# Patient Record
Sex: Female | Born: 1960
Health system: Southern US, Community
[De-identification: ages and names within clinical notes are randomized; demographics above are authoritative.]

## PROBLEM LIST (undated history)

## (undated) DIAGNOSIS — I10 Essential (primary) hypertension: Secondary | ICD-10-CM

## (undated) DIAGNOSIS — I499 Cardiac arrhythmia, unspecified: Secondary | ICD-10-CM

## (undated) DIAGNOSIS — F4322 Adjustment disorder with anxiety: Secondary | ICD-10-CM

## (undated) HISTORY — PX: ABDOMINAL HYSTERECTOMY: SHX81

---

## 2005-07-31 ENCOUNTER — Ambulatory Visit: Payer: Self-pay | Admitting: Family Medicine

## 2013-03-26 ENCOUNTER — Emergency Department: Payer: Self-pay | Admitting: Emergency Medicine

## 2013-03-26 LAB — BASIC METABOLIC PANEL
Anion Gap: 9 (ref 7–16)
Calcium, Total: 9.3 mg/dL (ref 8.5–10.1)
Chloride: 104 mmol/L (ref 98–107)
Co2: 27 mmol/L (ref 21–32)
Creatinine: 0.87 mg/dL (ref 0.60–1.30)
EGFR (African American): >60
Osmolality: 276 (ref 275–301)
Potassium: 3.9 mmol/L (ref 3.5–5.1)

## 2013-03-26 LAB — URINALYSIS, COMPLETE
Glucose,UR: NEGATIVE mg/dL (ref 0–75)
Ketone: NEGATIVE
Leukocyte Esterase: NEGATIVE
Nitrite: NEGATIVE
Ph: 6 (ref 4.5–8.0)
RBC,UR: NONE SEEN /HPF (ref 0–5)
Specific Gravity: 1.002 (ref 1.003–1.030)
Squamous Epithelial: NONE SEEN
WBC UR: NONE SEEN /HPF (ref 0–5)

## 2013-03-26 LAB — CK TOTAL AND CKMB (NOT AT ARMC)
CK, Total: 180 U/L (ref 21–215)
CK, Total: 144 U/L (ref 21–215)
CK-MB: 2.2 ng/mL (ref 0.5–3.6)
CK-MB: 1.9 ng/mL (ref 0.5–3.6)

## 2013-03-26 LAB — TROPONIN I
Troponin-I: <0.02 ng/mL
Troponin-I: <0.02 ng/mL

## 2013-03-26 LAB — HEPATIC FUNCTION PANEL A (ARMC)
Albumin: 4.5 g/dL (ref 3.4–5.0)
Bilirubin,Total: 0.4 mg/dL (ref 0.2–1.0)
SGOT(AST): 24 U/L (ref 15–37)
SGPT (ALT): 17 U/L (ref 12–78)

## 2013-03-26 LAB — CBC: Platelet: 194 10*3/uL (ref 150–440)

## 2013-03-26 LAB — MAGNESIUM: Magnesium: 1.6 mg/dL — ABNORMAL LOW

## 2013-07-18 ENCOUNTER — Ambulatory Visit: Payer: Self-pay | Admitting: Gastroenterology

## 2013-07-22 LAB — PATHOLOGY REPORT

## 2013-07-24 ENCOUNTER — Ambulatory Visit: Payer: Self-pay | Admitting: Internal Medicine

## 2013-08-01 ENCOUNTER — Ambulatory Visit: Payer: Self-pay | Admitting: Rheumatology

## 2013-09-23 ENCOUNTER — Emergency Department: Payer: Self-pay | Admitting: Emergency Medicine

## 2013-09-23 LAB — BASIC METABOLIC PANEL
Anion Gap: 6 — ABNORMAL LOW (ref 7–16)
BUN: 13 mg/dL (ref 7–18)
CHLORIDE: 108 mmol/L — AB (ref 98–107)
CO2: 28 mmol/L (ref 21–32)
Calcium, Total: 8.9 mg/dL (ref 8.5–10.1)
Creatinine: 0.84 mg/dL (ref 0.60–1.30)
EGFR (African American): >60
EGFR (Non-African Amer.): >60
Glucose: 107 mg/dL — ABNORMAL HIGH (ref 65–99)
OSMOLALITY: 284 (ref 275–301)
Potassium: 3.6 mmol/L (ref 3.5–5.1)
SODIUM: 142 mmol/L (ref 136–145)

## 2013-09-23 LAB — TROPONIN I: Troponin-I: <0.02 ng/mL

## 2013-09-23 LAB — CBC
HCT: 40.8 % (ref 35.0–47.0)
HGB: 14.1 g/dL (ref 12.0–16.0)
MCH: 29.3 pg (ref 26.0–34.0)
MCHC: 34.5 g/dL (ref 32.0–36.0)
MCV: 85 fL (ref 80–100)
Platelet: 200 10*3/uL (ref 150–440)
RBC: 4.81 10*6/uL (ref 3.80–5.20)
RDW: 13.7 % (ref 11.5–14.5)
WBC: 9.3 10*3/uL (ref 3.6–11.0)

## 2013-10-24 ENCOUNTER — Ambulatory Visit: Payer: Self-pay | Admitting: Rheumatology

## 2013-12-02 DIAGNOSIS — M7918 Myalgia, other site: Secondary | ICD-10-CM | POA: Insufficient documentation

## 2014-02-12 DIAGNOSIS — M62838 Other muscle spasm: Secondary | ICD-10-CM | POA: Insufficient documentation

## 2014-05-18 DIAGNOSIS — M542 Cervicalgia: Secondary | ICD-10-CM | POA: Insufficient documentation

## 2014-09-05 DIAGNOSIS — L409 Psoriasis, unspecified: Secondary | ICD-10-CM | POA: Insufficient documentation

## 2014-09-05 DIAGNOSIS — E7800 Pure hypercholesterolemia, unspecified: Secondary | ICD-10-CM | POA: Insufficient documentation

## 2014-09-17 DIAGNOSIS — M5416 Radiculopathy, lumbar region: Secondary | ICD-10-CM | POA: Insufficient documentation

## 2014-09-24 DIAGNOSIS — Z720 Tobacco use: Secondary | ICD-10-CM | POA: Insufficient documentation

## 2014-09-24 DIAGNOSIS — G479 Sleep disorder, unspecified: Secondary | ICD-10-CM | POA: Insufficient documentation

## 2014-09-29 ENCOUNTER — Ambulatory Visit: Payer: Self-pay | Admitting: Physical Medicine and Rehabilitation

## 2014-09-30 ENCOUNTER — Ambulatory Visit: Payer: Self-pay | Admitting: Family Medicine

## 2014-11-12 ENCOUNTER — Ambulatory Visit: Payer: Self-pay | Admitting: Family Medicine

## 2015-07-16 ENCOUNTER — Other Ambulatory Visit: Payer: Self-pay | Admitting: Neurology

## 2015-07-16 ENCOUNTER — Telehealth (HOSPITAL_COMMUNITY): Payer: Self-pay | Admitting: Neurology

## 2015-07-16 DIAGNOSIS — H532 Diplopia: Secondary | ICD-10-CM

## 2015-07-30 ENCOUNTER — Ambulatory Visit
Admission: RE | Admit: 2015-07-30 | Discharge: 2015-07-30 | Disposition: A | Discharge status: Home / Self Care | Payer: BLUE CROSS/BLUE SHIELD | Source: Ambulatory Visit | Attending: Neurology | Admitting: Neurology

## 2015-07-30 DIAGNOSIS — H532 Diplopia: Secondary | ICD-10-CM | POA: Diagnosis present

## 2015-11-08 DIAGNOSIS — M5136 Other intervertebral disc degeneration, lumbar region: Secondary | ICD-10-CM | POA: Insufficient documentation

## 2016-08-14 DIAGNOSIS — E538 Deficiency of other specified B group vitamins: Secondary | ICD-10-CM | POA: Insufficient documentation

## 2016-08-14 DIAGNOSIS — R202 Paresthesia of skin: Secondary | ICD-10-CM | POA: Insufficient documentation

## 2016-08-14 DIAGNOSIS — G609 Hereditary and idiopathic neuropathy, unspecified: Secondary | ICD-10-CM | POA: Insufficient documentation

## 2016-10-17 DIAGNOSIS — G5603 Carpal tunnel syndrome, bilateral upper limbs: Secondary | ICD-10-CM | POA: Insufficient documentation

## 2016-10-20 IMAGING — MR MRI LUMBAR SPINE WITHOUT CONTRAST
4 of 5 series · 24 of 48 positions shown · non-contrast
Comparison: Thoracic MRI 08/01/2013.

CLINICAL DATA: Low back pain with bilateral lower extremity
tingling and burning for 1 year. No acute injury or prior relevant
surgery. Initial encounter.

EXAM:
MRI LUMBAR SPINE WITHOUT CONTRAST
TECHNIQUE: Multiplanar, multisequence MR imaging of the lumbar spine was
performed. No intravenous contrast was administered.

[Series 2: T2 · sagittal · 4.0mm · 0.81mm/px · 5 of 13 slices shown (1 of 2)]
[im 1/13]
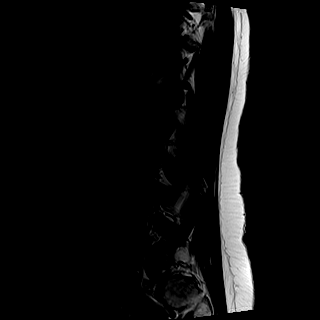
[im 4/13]
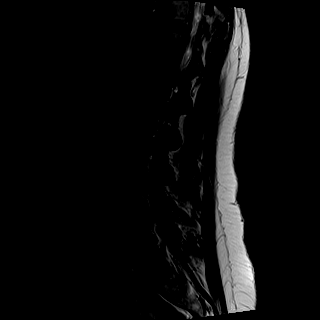
[im 7/13]
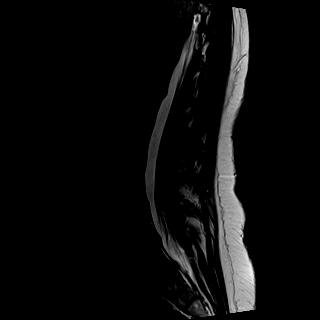
[im 10/13]
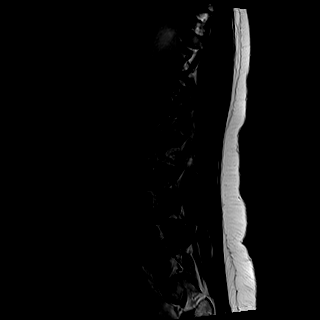
[im 13/13]
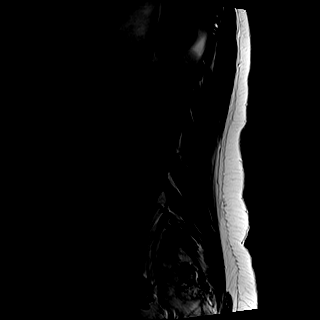

[Series 3: T1 · sagittal · 4.0mm · 0.41mm/px · 5 of 13 slices shown (1 of 2)]
[im 1/13]
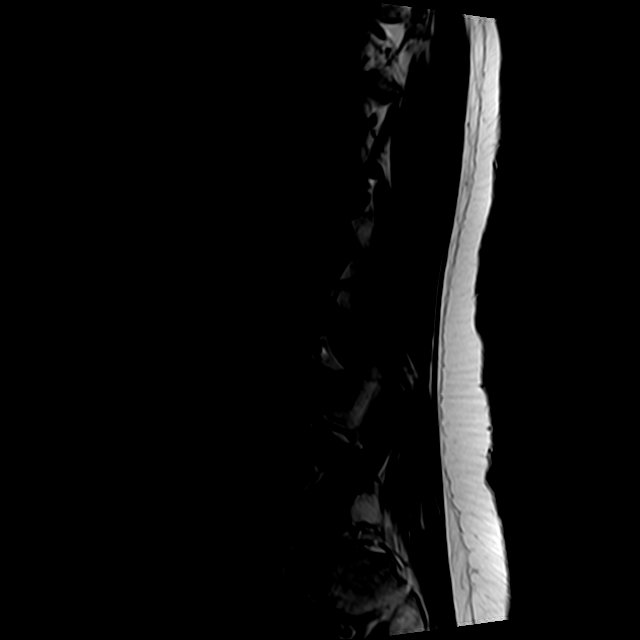
[im 4/13]
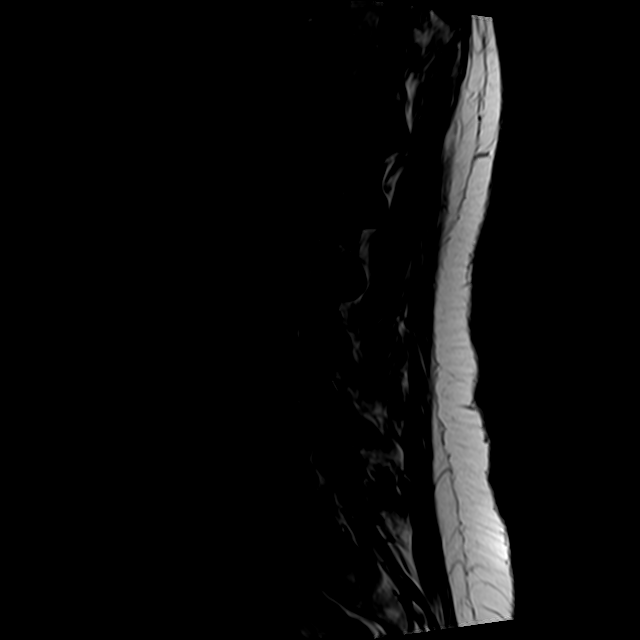
[im 7/13]
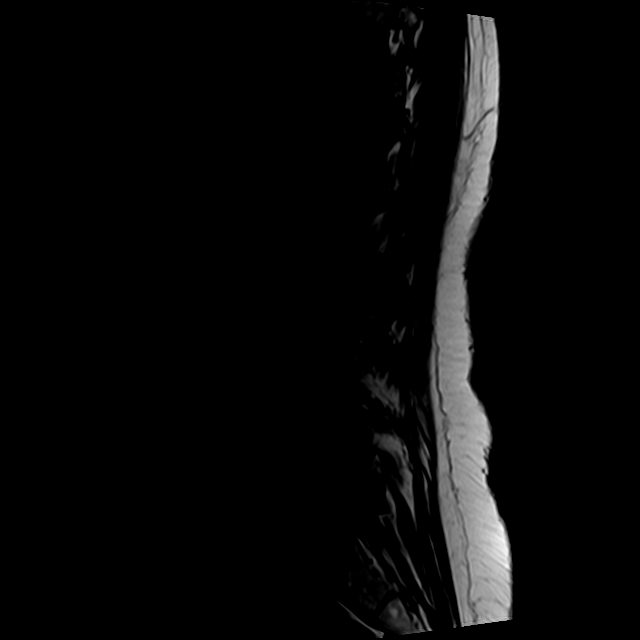
[im 10/13]
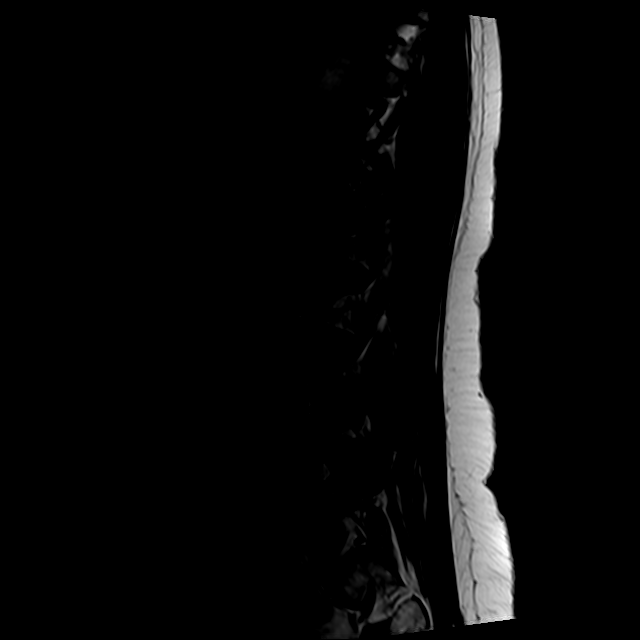
[im 13/13]
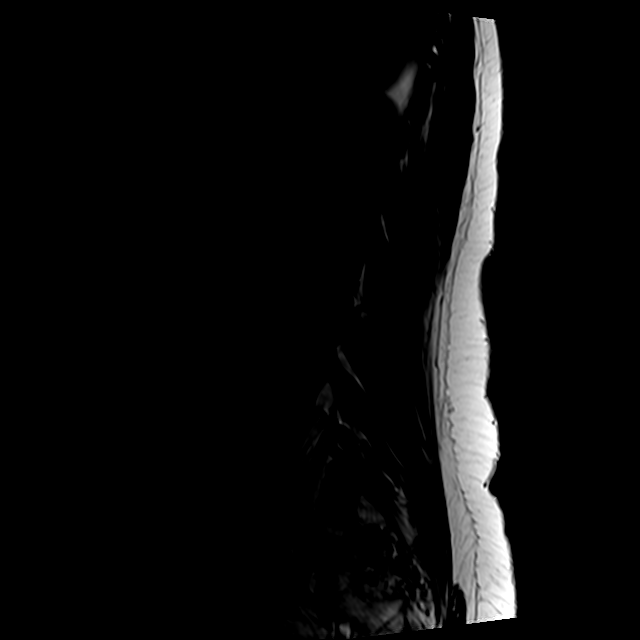

[Series 5: T2 · axial · 4.0mm · 0.78mm/px · z∈[-137,+73]mm · 10 of 36 slices shown (2 of 2)]
[im 3/36]
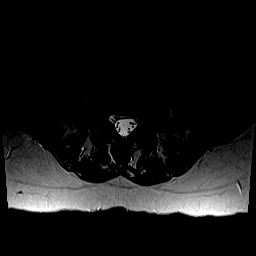
[im 5/36]
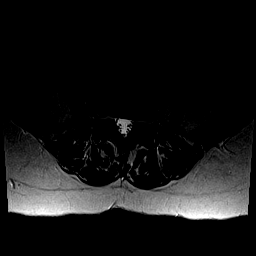
[im 8/36]
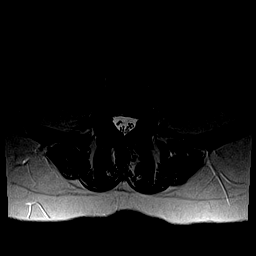
[im 12/36]
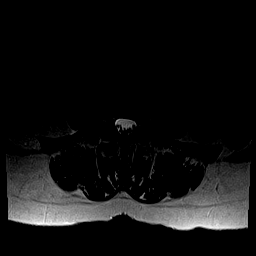
[im 17/36]
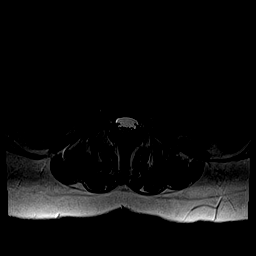
[im 19/36]
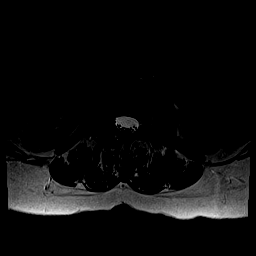
[im 22/36]
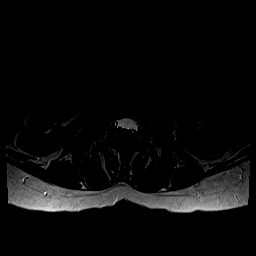
[im 26/36]
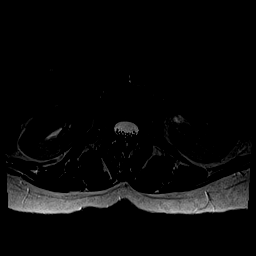
[im 31/36]
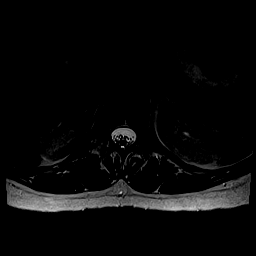
[im 36/36]
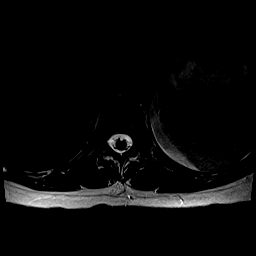

[Series 6: T1 · axial · 4.0mm · 0.31mm/px · z∈[-137,+48]mm · 4 of 36 slices shown (2 of 2)]
[im 3/36]
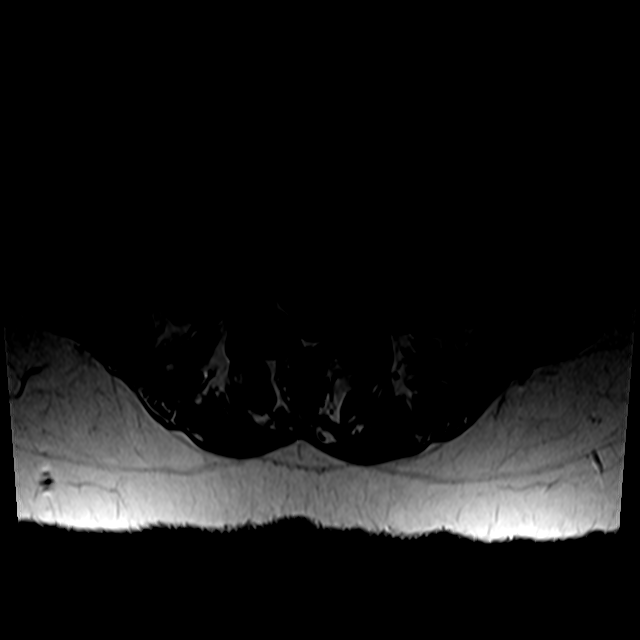
[im 5/36]
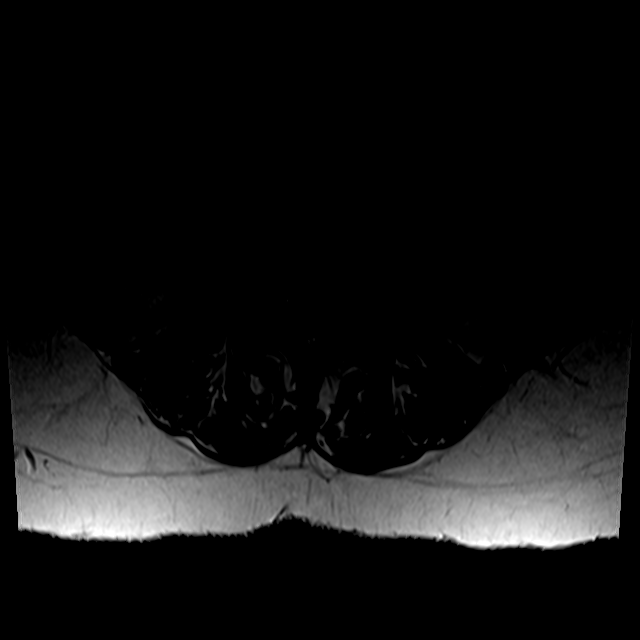
[im 19/36]
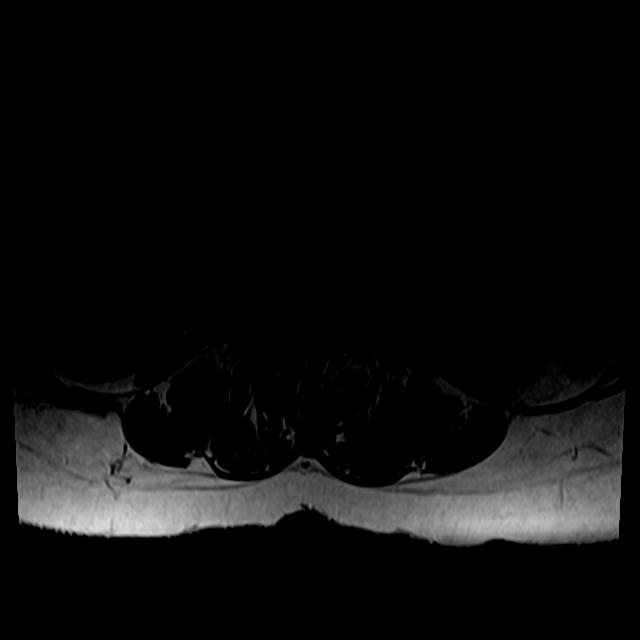
[im 31/36]
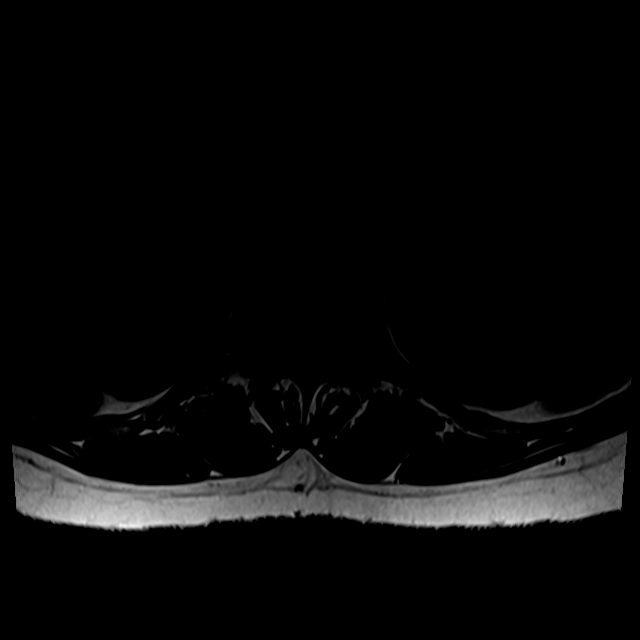

[24 of 48 positions shown; findings below may reference images not displayed]

FINDINGS: There is transitional lumbosacral anatomy. Based on the numbering
applied to the prior thoracic examination, there is a transitional
partially sacralized L5 segment. There is a mild convex left
scoliosis. There is no evidence of fracture or pars defect.

The conus medullaris extends to the T12-L1 level and appears normal.
No paraspinal abnormalities are identified.

There is stable disc and endplate degeneration at T10-11 and T12-L1,
but no resulting spinal stenosis or nerve root encroachment.

There are no significant disc space findings at L1-2 or L2-3.

L3-4: Mild disc bulging, facet and ligamentous hypertrophy. There is
a slight anterolisthesis, but no resulting spinal stenosis or nerve
root encroachment.

L4-5: The disc is degenerated with mild loss of height and annular
bulging. There is mild facet and ligamentous hypertrophy. No
significant spinal stenosis or nerve root encroachment.

L5-S1: As numbered, this is a transitional disc space level without
acquired abnormality.
IMPRESSION: 1. Transitional lumbosacral anatomy. As correlated with prior
examination of the thoracic spine, there is a transitional partially
sacralized L5 segment.
2. Disc bulging, facet and ligamentous hypertrophy at L3-4 and L4-5
without resulting spinal stenosis or nerve root encroachment.
3. No acute findings.

## 2016-11-30 ENCOUNTER — Other Ambulatory Visit: Payer: Self-pay | Admitting: Family Medicine

## 2016-11-30 DIAGNOSIS — Z1231 Encounter for screening mammogram for malignant neoplasm of breast: Secondary | ICD-10-CM

## 2016-12-12 ENCOUNTER — Ambulatory Visit
Admission: RE | Admit: 2016-12-12 | Discharge: 2016-12-12 | Disposition: A | Discharge status: Home / Self Care | Payer: BLUE CROSS/BLUE SHIELD | Source: Ambulatory Visit | Attending: Family Medicine | Admitting: Family Medicine

## 2016-12-12 DIAGNOSIS — Z1231 Encounter for screening mammogram for malignant neoplasm of breast: Secondary | ICD-10-CM | POA: Diagnosis not present

## 2017-10-17 ENCOUNTER — Other Ambulatory Visit: Payer: Self-pay | Admitting: Family Medicine

## 2017-10-17 DIAGNOSIS — R1011 Right upper quadrant pain: Secondary | ICD-10-CM

## 2018-09-19 DIAGNOSIS — I10 Essential (primary) hypertension: Secondary | ICD-10-CM | POA: Insufficient documentation

## 2018-09-19 DIAGNOSIS — F4322 Adjustment disorder with anxiety: Secondary | ICD-10-CM | POA: Insufficient documentation

## 2021-11-29 DIAGNOSIS — Z0000 Encounter for general adult medical examination without abnormal findings: Secondary | ICD-10-CM | POA: Diagnosis not present

## 2021-11-29 DIAGNOSIS — Z23 Encounter for immunization: Secondary | ICD-10-CM | POA: Diagnosis not present

## 2021-11-29 DIAGNOSIS — I10 Essential (primary) hypertension: Secondary | ICD-10-CM | POA: Diagnosis not present

## 2021-11-29 DIAGNOSIS — E7800 Pure hypercholesterolemia, unspecified: Secondary | ICD-10-CM | POA: Diagnosis not present

## 2021-11-29 DIAGNOSIS — E559 Vitamin D deficiency, unspecified: Secondary | ICD-10-CM | POA: Diagnosis not present

## 2021-11-29 DIAGNOSIS — Z1331 Encounter for screening for depression: Secondary | ICD-10-CM | POA: Diagnosis not present

## 2021-11-29 DIAGNOSIS — R7303 Prediabetes: Secondary | ICD-10-CM | POA: Diagnosis not present

## 2022-09-12 DIAGNOSIS — R7303 Prediabetes: Secondary | ICD-10-CM | POA: Insufficient documentation

## 2022-09-12 DIAGNOSIS — M5135 Other intervertebral disc degeneration, thoracolumbar region: Secondary | ICD-10-CM | POA: Insufficient documentation

## 2023-03-27 ENCOUNTER — Other Ambulatory Visit: Payer: Self-pay

## 2023-03-27 ENCOUNTER — Other Ambulatory Visit: Payer: Self-pay | Admitting: Family Medicine

## 2023-03-27 ENCOUNTER — Other Ambulatory Visit: Payer: Self-pay | Admitting: *Deleted

## 2023-03-27 ENCOUNTER — Telehealth: Payer: Self-pay | Admitting: Gastroenterology

## 2023-03-27 DIAGNOSIS — F17210 Nicotine dependence, cigarettes, uncomplicated: Secondary | ICD-10-CM

## 2023-03-27 DIAGNOSIS — Z1211 Encounter for screening for malignant neoplasm of colon: Secondary | ICD-10-CM

## 2023-03-27 DIAGNOSIS — Z1231 Encounter for screening mammogram for malignant neoplasm of breast: Secondary | ICD-10-CM

## 2023-03-27 NOTE — Telephone Encounter (Signed)
Message left for patient to return my call.  

## 2023-03-27 NOTE — Telephone Encounter (Signed)
Pt left message to schedule colonoscopy please return call  

## 2023-03-28 NOTE — Telephone Encounter (Signed)
Patient returned the call to schedule her colonoscopy. The best time to call is early mornings.

## 2023-03-29 ENCOUNTER — Other Ambulatory Visit: Payer: Self-pay

## 2023-03-29 DIAGNOSIS — Z1211 Encounter for screening for malignant neoplasm of colon: Secondary | ICD-10-CM

## 2023-03-29 MED ORDER — NA SULFATE-K SULFATE-MG SULF 17.5-3.13-1.6 GM/177ML PO SOLN: 1.0000 | Freq: Once | ORAL | 0 refills | Status: AC

## 2023-03-29 NOTE — Addendum Note (Signed)
Addended by: Avie Arenas on: 03/29/2023 09:59 AM   Modules accepted: Orders

## 2023-03-29 NOTE — Telephone Encounter (Signed)
Gastroenterology Pre-Procedure Review  Request Date: 05/03/23 Requesting Physician: Dr. Allegra Lai  PATIENT REVIEW QUESTIONS: The patient responded to the following health history questions as indicated:    1. Are you having any GI issues? no 2. Do you have a personal history of Polyps? no 3. Do you have a family history of Colon Cancer or Polyps? no 4. Diabetes Mellitus? no 5. Joint replacements in the past 12 months?no 6. Major health problems in the past 3 months?no 7. Any artificial heart valves, MVP, or defibrillator?no    MEDICATIONS & ALLERGIES:    Patient reports the following regarding taking any anticoagulation/antiplatelet therapy:   Plavix, Coumadin, Eliquis, Xarelto, Lovenox, Pradaxa, Brilinta, or Effient? no Aspirin? no  Patient confirms/reports the following medications:  Current Outpatient Medications  Medication Sig Dispense Refill   acetaminophen (TYLENOL) 325 MG tablet Take by mouth.     amLODipine (NORVASC) 10 MG tablet Take 1 tablet by mouth daily.     aspirin EC 81 MG tablet Take by mouth.     atorvastatin (LIPITOR) 10 MG tablet Take 1 tablet by mouth daily.     cholecalciferol (VITAMIN D3) 25 MCG (1000 UNIT) tablet Take 1,000 Units by mouth daily.     ibuprofen (ADVIL) 200 MG tablet Take by mouth.     metFORMIN (GLUCOPHAGE) 500 MG tablet Take 1 tablet by mouth daily with breakfast.     metoprolol succinate (TOPROL-XL) 25 MG 24 hr tablet Take 1 tablet by mouth daily.     Na Sulfate-K Sulfate-Mg Sulf 17.5-3.13-1.6 GM/177ML SOLN Take 1 kit by mouth once for 1 dose. 354 mL 0   No current facility-administered medications for this visit.    Patient confirms/reports the following allergies:  Allergies  Allergen Reactions   Bupropion Other (See Comments)    Trouble focusing    No orders of the defined types were placed in this encounter.   AUTHORIZATION INFORMATION Primary Insurance: 1D#: Group #:  Secondary Insurance: 1D#: Group #:  SCHEDULE  INFORMATION: Date: 05/03/23 Time: Location: [facility]

## 2023-04-03 ENCOUNTER — Ambulatory Visit
Admission: RE | Admit: 2023-04-03 | Discharge: 2023-04-03 | Disposition: A | Discharge status: Home / Self Care | Payer: 59 | Source: Ambulatory Visit | Attending: Family Medicine | Admitting: Family Medicine

## 2023-04-03 DIAGNOSIS — F17210 Nicotine dependence, cigarettes, uncomplicated: Secondary | ICD-10-CM | POA: Diagnosis present

## 2023-04-09 ENCOUNTER — Encounter: Payer: Self-pay | Admitting: Gastroenterology

## 2023-04-24 ENCOUNTER — Ambulatory Visit
Admission: RE | Admit: 2023-04-24 | Discharge: 2023-04-24 | Disposition: A | Discharge status: Home / Self Care | Payer: 59 | Source: Ambulatory Visit | Attending: Family Medicine | Admitting: Family Medicine

## 2023-04-24 DIAGNOSIS — Z1231 Encounter for screening mammogram for malignant neoplasm of breast: Secondary | ICD-10-CM | POA: Insufficient documentation

## 2023-04-26 ENCOUNTER — Telehealth: Payer: Self-pay

## 2023-04-26 ENCOUNTER — Telehealth: Payer: Self-pay | Admitting: Gastroenterology

## 2023-04-26 NOTE — Telephone Encounter (Signed)
Patient call returned.  Left voice message to let her know that she is currently scheduled at [facility] with Dr. Allegra Lai for 05/10/23.  Requested a call back to discuss her location change request.  Thanks,  Marcelino Duster, [place]

## 2023-04-26 NOTE — Telephone Encounter (Signed)
Pt left message to change location for up coming procedure  on 05/10/2023 requesting a call back

## 2023-05-03 ENCOUNTER — Encounter: Payer: Self-pay | Admitting: Gastroenterology

## 2023-05-03 NOTE — Anesthesia Preprocedure Evaluation (Addendum)
Anesthesia Evaluation  Patient identified by MRN, date of birth, ID band Patient awake    Reviewed: Allergy & Precautions, H&P , NPO status , Patient's Chart, lab work & pertinent test results  Airway Mallampati: I  TM Distance: >3 FB Neck ROM: Full    Dental no notable dental hx. (+) Upper Dentures, Lower Dentures Dentures have dental adhesive and stay in well:   Pulmonary neg pulmonary ROS   Pulmonary exam normal breath sounds clear to auscultation       Cardiovascular hypertension, Normal cardiovascular exam+ dysrhythmias  Rhythm:Regular Rate:Normal     Neuro/Psych  PSYCHIATRIC DISORDERS       Neuromuscular disease negative neurological ROS  negative psych ROS   GI/Hepatic negative GI ROS, Neg liver ROS,,,  Endo/Other  negative endocrine ROS    Renal/GU negative Renal ROS  negative genitourinary   Musculoskeletal negative musculoskeletal ROS (+) Arthritis ,    Abdominal   Peds negative pediatric ROS (+)  Hematology negative hematology ROS (+)   Anesthesia Other Findings Smoker anxiety  Reproductive/Obstetrics negative OB ROS                             Anesthesia Physical Anesthesia Plan  ASA: 2  Anesthesia Plan: General   Post-op Pain Management:    Induction: Intravenous  PONV Risk Score and Plan:   Airway Management Planned: Natural Airway and Nasal Cannula  Additional Equipment:   Intra-op Plan:   Post-operative Plan:   Informed Consent: I have reviewed the patients History and Physical, chart, labs and discussed the procedure including the risks, benefits and alternatives for the proposed anesthesia with the patient or authorized representative who has indicated his/her understanding and acceptance.     Dental Advisory Given  Plan Discussed with: Anesthesiologist, CRNA and Surgeon  Anesthesia Plan Comments: (Patient consented for risks of anesthesia including  but not limited to:  - adverse reactions to medications - risk of airway placement if required - damage to eyes, teeth, lips or other oral mucosa - nerve damage due to positioning  - sore throat or hoarseness - Damage to heart, brain, nerves, lungs, other parts of body or loss of life  Patient voiced understanding.)       Anesthesia Quick Evaluation

## 2023-05-04 ENCOUNTER — Encounter: Payer: Self-pay | Admitting: Gastroenterology

## 2023-05-10 ENCOUNTER — Ambulatory Visit: Payer: 59 | Admitting: Anesthesiology

## 2023-05-10 ENCOUNTER — Encounter
Admission: RE | Disposition: A | Discharge status: Home / Self Care | Payer: Self-pay | Source: Home / Self Care | Attending: Gastroenterology

## 2023-05-10 ENCOUNTER — Encounter: Payer: Self-pay | Admitting: Gastroenterology

## 2023-05-10 ENCOUNTER — Other Ambulatory Visit: Payer: Self-pay

## 2023-05-10 ENCOUNTER — Ambulatory Visit
Admission: RE | Admit: 2023-05-10 | Discharge: 2023-05-10 | Disposition: A | Discharge status: Home / Self Care | Payer: 59 | Attending: Gastroenterology | Admitting: Gastroenterology

## 2023-05-10 DIAGNOSIS — Z1211 Encounter for screening for malignant neoplasm of colon: Secondary | ICD-10-CM | POA: Diagnosis present

## 2023-05-10 DIAGNOSIS — K635 Polyp of colon: Secondary | ICD-10-CM

## 2023-05-10 DIAGNOSIS — K644 Residual hemorrhoidal skin tags: Secondary | ICD-10-CM | POA: Diagnosis not present

## 2023-05-10 DIAGNOSIS — D124 Benign neoplasm of descending colon: Secondary | ICD-10-CM | POA: Insufficient documentation

## 2023-05-10 DIAGNOSIS — I10 Essential (primary) hypertension: Secondary | ICD-10-CM | POA: Diagnosis not present

## 2023-05-10 DIAGNOSIS — Z7984 Long term (current) use of oral hypoglycemic drugs: Secondary | ICD-10-CM | POA: Insufficient documentation

## 2023-05-10 DIAGNOSIS — K5730 Diverticulosis of large intestine without perforation or abscess without bleeding: Secondary | ICD-10-CM | POA: Insufficient documentation

## 2023-05-10 DIAGNOSIS — D122 Benign neoplasm of ascending colon: Secondary | ICD-10-CM | POA: Diagnosis not present

## 2023-05-10 HISTORY — PX: POLYPECTOMY: SHX5525

## 2023-05-10 HISTORY — PX: COLONOSCOPY WITH PROPOFOL: SHX5780

## 2023-05-10 HISTORY — DX: Essential (primary) hypertension: I10

## 2023-05-10 HISTORY — DX: Cardiac arrhythmia, unspecified: I49.9

## 2023-05-10 HISTORY — DX: Adjustment disorder with anxiety: F43.22

## 2023-05-10 LAB — GLUCOSE, CAPILLARY
Glucose-Capillary: 74 mg/dL (ref 70–99)
Glucose-Capillary: 97 mg/dL (ref 70–99)

## 2023-05-10 SURGERY — COLONOSCOPY WITH PROPOFOL
Anesthesia: General | Site: Rectum

## 2023-05-10 MED ORDER — STERILE WATER FOR IRRIGATION IR SOLN
Status: DC | PRN
  Administered 2023-05-10: 1

## 2023-05-10 MED ORDER — LIDOCAINE HCL (CARDIAC) PF 100 MG/5ML IV SOSY
PREFILLED_SYRINGE | INTRAVENOUS | Status: DC | PRN
  Administered 2023-05-10: 50 mg via INTRAVENOUS

## 2023-05-10 MED ORDER — DEXTROSE 50 % IV SOLN
15.0000 mL | Freq: Once | INTRAVENOUS | Status: AC
  Administered 2023-05-10: 15 mL via INTRAVENOUS

## 2023-05-10 MED ORDER — PROPOFOL 10 MG/ML IV BOLUS
INTRAVENOUS | Status: DC | PRN
  Administered 2023-05-10: 40 mg via INTRAVENOUS
  Administered 2023-05-10: 80 mg via INTRAVENOUS
  Administered 2023-05-10: 40 mg via INTRAVENOUS
  Administered 2023-05-10: 50 mg via INTRAVENOUS
  Administered 2023-05-10: 20 mg via INTRAVENOUS

## 2023-05-10 MED ORDER — STERILE WATER FOR IRRIGATION IR SOLN
Status: DC | PRN
  Administered 2023-05-10: 60 mL

## 2023-05-10 MED ORDER — LACTATED RINGERS IV SOLN: INTRAVENOUS | Status: DC

## 2023-05-10 SURGICAL SUPPLY — 11 items
FCP ESCP3.2XJMB 240X2.8X (MISCELLANEOUS) ×2
FORCEPS BIOP RJ4 240 W/NDL (MISCELLANEOUS) ×2
FORCEPS ESCP3.2XJMB 240X2.8X (MISCELLANEOUS) IMPLANT
GOWN CVR UNV OPN BCK APRN NK (MISCELLANEOUS) ×4 IMPLANT
GOWN ISOL THUMB LOOP REG UNIV (MISCELLANEOUS) ×4
KIT PRC NS LF DISP ENDO (KITS) ×2 IMPLANT
KIT PROCEDURE OLYMPUS (KITS) ×2
MANIFOLD NEPTUNE II (INSTRUMENTS) ×2 IMPLANT
SNARE COLD EXACTO (MISCELLANEOUS) IMPLANT
TRAP ETRAP POLY (MISCELLANEOUS) IMPLANT
WATER STERILE IRR 250ML POUR (IV SOLUTION) ×2 IMPLANT

## 2023-05-10 NOTE — Transfer of Care (Signed)
Immediate Anesthesia Transfer of Care Note  Patient: Sherry Rivere  Procedure(s) Performed: COLONOSCOPY WITH PROPOFOL (Rectum) POLYPECTOMY (Rectum)  Patient Location: PACU  Anesthesia Type: General  Level of Consciousness: awake, alert  and patient cooperative  Airway and Oxygen Therapy: Patient Spontanous Breathing and Patient connected to supplemental oxygen  Post-op Assessment: Post-op Vital signs reviewed, Patient's Cardiovascular Status Stable, Respiratory Function Stable, Patent Airway and No signs of Nausea or vomiting  Post-op Vital Signs: Reviewed and stable  Complications: No notable events documented.

## 2023-05-10 NOTE — Anesthesia Postprocedure Evaluation (Signed)
Anesthesia Post Note  Patient: Sherry Flynn  Procedure(s) Performed: COLONOSCOPY WITH PROPOFOL (Rectum) POLYPECTOMY (Rectum)  Patient location during evaluation: PACU Anesthesia Type: General Level of consciousness: awake and alert Pain management: pain level controlled Vital Signs Assessment: post-procedure vital signs reviewed and stable Respiratory status: spontaneous breathing, nonlabored ventilation, respiratory function stable and patient connected to nasal cannula oxygen Cardiovascular status: blood pressure returned to baseline and stable Postop Assessment: no apparent nausea or vomiting Anesthetic complications: no   No notable events documented.   Last Vitals:  Vitals:   05/10/23 0917 05/10/23 0922  BP: 97/67 101/68  Pulse:  71  Resp: 18   Temp:    SpO2:  97%    Last Pain:  Vitals:   05/10/23 0922  TempSrc:   PainSc: 0-No pain                 Marisue Humble

## 2023-05-10 NOTE — H&P (Signed)
Arlyss Repress, MD [address]  [address]  [place], [place] [address]  Main: [phone]  Fax: [phone] Pager: [phone]  Primary Care Physician:  Damaris Schooner, DO Primary Gastroenterologist:  Dr. Arlyss Repress  Pre-Procedure History & Physical: HPI:  Sherry Flynn is a 62 y.o. female is here for an colonoscopy.   Past Medical History:  Diagnosis Date   Adjustment reaction with anxiety    Dysrhythmia    Hypertension     Past Surgical History:  Procedure Laterality Date   ABDOMINAL HYSTERECTOMY      Prior to Admission medications   Medication Sig Start Date End Date Taking? Authorizing Provider  acetaminophen (TYLENOL) 325 MG tablet Take by mouth.   Yes [provider]  amLODipine (NORVASC) 10 MG tablet Take 1 tablet by mouth daily. 10/09/22 10/09/23 Yes [provider]  aspirin EC 81 MG tablet Take by mouth.   Yes [provider]  atorvastatin (LIPITOR) 10 MG tablet Take 1 tablet by mouth daily. 01/05/16  Yes [provider]  cholecalciferol (VITAMIN D3) 25 MCG (1000 UNIT) tablet Take 1,000 Units by mouth daily.   Yes [provider]  ibuprofen (ADVIL) 200 MG tablet Take by mouth.   Yes [provider]  metFORMIN (GLUCOPHAGE) 500 MG tablet Take 1 tablet by mouth daily with breakfast. 09/15/22 09/15/23 Yes [provider]  metoprolol succinate (TOPROL-XL) 25 MG 24 hr tablet Take 1 tablet by mouth daily. 09/12/22 09/12/23 Yes [provider]    Allergies as of 03/29/2023 - Review Complete 03/29/2023  Allergen Reaction Noted   Bupropion Other (See Comments) 03/16/2017    Family History  Problem Relation Age of Onset   Breast cancer Maternal Aunt 63   Breast cancer Maternal Grandmother        mat great gm    Social History   Socioeconomic History   Marital status: Divorced    Spouse name: Not on file   Number of children: Not on file   Years of education: Not on file   Highest  education level: Not on file  Occupational History   Not on file  Tobacco Use   Smoking status: Not on file   Smokeless tobacco: Not on file  Substance and Sexual Activity   Alcohol use: Not on file   Drug use: Not on file   Sexual activity: Not on file  Other Topics Concern   Not on file  Social History Narrative   Not on file   Social Determinants of Health   Financial Resource Strain: Low Risk  (03/18/2023)   Received from [facility], [facility]   Overall Financial Resource Strain (CARDIA)    Difficulty of Paying Living Expenses: Not very hard  Food Insecurity: No Food Insecurity (03/18/2023)   [facility] from [facility], [facility]   Hunger Vital Sign    Worried About Running Out of Food in the Last Year: Never true    Ran Out of Food in the Last Year: Never true  Transportation Needs: No Transportation Needs (03/18/2023)   Received from [facility], [facility]   [facility] - Transportation    In the past 12 months, has lack of transportation kept you from medical appointments or from getting medications?: No    Lack of Transportation (Non-Medical): No  Physical Activity: Not on file  Stress: Not on file  Social Connections: Not on file  Intimate Partner  Violence: Not on file    Review of Systems: See HPI, otherwise negative ROS  Physical Exam: BP 137/83   Temp 97.7 F (36.5 C) (Temporal)   Resp 17   Ht 5' 0.98" (1.549 m)   Wt 60 kg   SpO2 100%   BMI 25.01 kg/m  General:   Alert,  pleasant and cooperative in NAD Head:  Normocephalic and atraumatic. Neck:  Supple; no masses or thyromegaly. Lungs:  Clear throughout to auscultation.    Heart:  Regular rate and rhythm. Abdomen:  Soft, nontender and nondistended. Normal bowel sounds, without guarding, and without rebound.   Neurologic:  Alert and  oriented x4;  grossly normal  neurologically.  Impression/Plan: [name] is here for an colonoscopy to be performed for colon cancer screening  Risks, benefits, limitations, and alternatives regarding  colonoscopy have been reviewed with the patient.  Questions have been answered.  All parties agreeable.   Lannette Donath, MD  05/10/2023, 7:31 AM

## 2023-05-10 NOTE — Op Note (Signed)
[facility] Gastroenterology Patient Name: Sherry Flynn Procedure Date: 05/10/2023 8:07 AM MRN: [id number] Account #: [id number] Date of Birth: [birth year] Admit Type: Outpatient Age: 62 Room: [facility] OR ROOM 01 Gender: Female Note Status: Finalized Instrument Name: [id number] Procedure:             Colonoscopy Indications:           Screening for colorectal malignant neoplasm, Last                         colonoscopy: October 2014, Last colonoscopy 10 years                         ago Providers:             Toney Reil MD, MD Referring MD:          Damaris Schooner (Referring MD) Medicines:             General Anesthesia Complications:         No immediate complications. Estimated blood loss: None. Procedure:             Pre-Anesthesia Assessment:                        - Prior to the procedure, a History and Physical was                         performed, and patient medications and allergies were                         reviewed. The patient is competent. The risks and                         benefits of the procedure and the sedation options and                         risks were discussed with the patient. All questions                         were answered and informed consent was obtained.                         Patient identification and proposed procedure were                         verified by the physician, the nurse, the                         anesthesiologist, the anesthetist and the technician                         in the pre-procedure area in the procedure room in the                         endoscopy suite. Mental Status Examination: alert and                         oriented. Airway Examination: normal oropharyngeal  airway and neck mobility. Respiratory Examination:                         clear to auscultation. CV Examination: normal.                         Prophylactic Antibiotics: The patient does not require                          prophylactic antibiotics. Prior Anticoagulants: The                         patient has taken no anticoagulant or antiplatelet                         agents. ASA Grade Assessment: II - A patient with mild                         systemic disease. After reviewing the risks and                         benefits, the patient was deemed in satisfactory                         condition to undergo the procedure. The anesthesia                         plan was to use general anesthesia. Immediately prior                         to administration of medications, the patient was                         re-assessed for adequacy to receive sedatives. The                         heart rate, respiratory rate, oxygen saturations,                         blood pressure, adequacy of pulmonary ventilation, and                         response to care were monitored throughout the                         procedure. The physical status of the patient was                         re-assessed after the procedure.                        After obtaining informed consent, the colonoscope was                         passed under direct vision. Throughout the procedure,                         the patient's blood pressure, pulse, and oxygen  saturations were monitored continuously. The                         Colonoscope was introduced through the anus and                         advanced to the the cecum, identified by appendiceal                         orifice and ileocecal valve. The colonoscopy was                         performed without difficulty. The patient tolerated                         the procedure well. The quality of the bowel                         preparation was evaluated using the BBPS [facility] Bowel                         Preparation Scale) with scores of: Right Colon = 3,                         Transverse Colon = 3 and Left Colon = 3 (entire mucosa                          seen well with no residual staining, small fragments                         of stool or opaque liquid). The total BBPS score                         equals 9. The ileocecal valve, appendiceal orifice,                         and rectum were photographed. Findings:      The perianal and digital rectal examinations were normal. Pertinent       negatives include normal sphincter tone and no palpable rectal lesions.      A diminutive polyp was found in the ascending colon. The polyp was       sessile. The polyp was removed with a jumbo cold forceps. Resection and       retrieval were complete.      Three sessile polyps were found in the ascending colon. The polyps were       4 to 7 mm in size. These polyps were removed with a cold snare.       Resection and retrieval were complete.      A diminutive polyp was found in the descending colon. The polyp was       sessile. The polyp was removed with a jumbo cold forceps. Resection and       retrieval were complete.      Multiple small-mouthed diverticula were found in the recto-sigmoid colon       and sigmoid colon.      Non-bleeding external hemorrhoids were found during retroflexion. The       hemorrhoids were medium-sized. Impression:            -  One diminutive polyp in the ascending colon, removed                         with a jumbo cold forceps. Resected and retrieved.                        - Three 4 to 7 mm polyps in the ascending colon,                         removed with a cold snare. Resected and retrieved.                        - One diminutive polyp in the descending colon,                         removed with a jumbo cold forceps. Resected and                         retrieved.                        - Diverticulosis in the recto-sigmoid colon and in the                         sigmoid colon.                        - Non-bleeding external hemorrhoids. Recommendation:        - Repeat colonoscopy in 3 years for  surveillance of                         multiple polyps.                        - Discharge patient to home (with escort).                        - Resume previous diet today.                        - Continue present medications.                        - Await pathology results. Procedure Code(s):     --- Professional ---                        [phone], Colonoscopy, flexible; with removal of                         tumor(s), polyp(s), or other lesion(s) by snare                         technique                        45380, 59, Colonoscopy, flexible; with biopsy, single                         or multiple Diagnosis Code(s):     --- Professional ---  Z12.11, Encounter for screening for malignant neoplasm                         of colon                        D12.2, Benign neoplasm of ascending colon                        D12.4, Benign neoplasm of descending colon                        K64.4, Residual hemorrhoidal skin tags                        K57.30, Diverticulosis of large intestine without                         perforation or abscess without bleeding CPT copyright 2022 American Medical Association. All rights reserved. The codes documented in this report are preliminary and upon coder review may  be revised to meet current compliance requirements. Dr. Libby Maw Toney Reil MD, MD 05/10/2023 9:06:11 AM This report has been signed electronically. Number of Addenda: 0 Note Initiated On: 05/10/2023 8:07 AM Scope Withdrawal Time: 0 hours 14 minutes 4 seconds  Total Procedure Duration: 0 hours 16 minutes 55 seconds  Estimated Blood Loss:  Estimated blood loss: none.      [facility]

## 2023-05-11 ENCOUNTER — Encounter: Payer: Self-pay | Admitting: Gastroenterology

## 2023-05-15 ENCOUNTER — Encounter: Payer: Self-pay | Admitting: Gastroenterology

## 2023-05-24 NOTE — Telephone Encounter (Signed)
error 

## 2023-10-01 ENCOUNTER — Encounter: Payer: Self-pay | Admitting: Family Medicine

## 2023-10-01 ENCOUNTER — Other Ambulatory Visit: Payer: Self-pay | Admitting: Family Medicine

## 2023-10-01 DIAGNOSIS — G44209 Tension-type headache, unspecified, not intractable: Secondary | ICD-10-CM

## 2023-10-01 DIAGNOSIS — R519 Headache, unspecified: Secondary | ICD-10-CM

## 2023-10-22 ENCOUNTER — Ambulatory Visit
Admission: RE | Admit: 2023-10-22 | Discharge: 2023-10-22 | Disposition: A | Discharge status: Home / Self Care | Payer: Medicaid Other | Source: Ambulatory Visit | Attending: Family Medicine | Admitting: Family Medicine

## 2023-10-22 DIAGNOSIS — R519 Headache, unspecified: Secondary | ICD-10-CM | POA: Insufficient documentation

## 2023-10-22 DIAGNOSIS — G44209 Tension-type headache, unspecified, not intractable: Secondary | ICD-10-CM | POA: Diagnosis present

## 2023-11-02 ENCOUNTER — Other Ambulatory Visit: Payer: Self-pay | Admitting: Family Medicine

## 2023-11-02 DIAGNOSIS — R3129 Other microscopic hematuria: Secondary | ICD-10-CM

## 2023-11-06 ENCOUNTER — Ambulatory Visit
Admission: RE | Admit: 2023-11-06 | Discharge: 2023-11-06 | Disposition: A | Discharge status: Home / Self Care | Payer: Medicaid Other | Source: Ambulatory Visit | Attending: Family Medicine | Admitting: Family Medicine

## 2023-11-06 DIAGNOSIS — R3129 Other microscopic hematuria: Secondary | ICD-10-CM | POA: Insufficient documentation

## 2023-11-06 MED ORDER — IOHEXOL 300 MG/ML  SOLN
100.0000 mL | Freq: Once | INTRAMUSCULAR | Status: AC | PRN
  Administered 2023-11-06: 100 mL via INTRAVENOUS

## 2023-11-28 ENCOUNTER — Other Ambulatory Visit: Payer: Self-pay

## 2023-11-28 DIAGNOSIS — R310 Gross hematuria: Secondary | ICD-10-CM

## 2023-11-30 ENCOUNTER — Other Ambulatory Visit
Admission: RE | Admit: 2023-11-30 | Discharge: 2023-11-30 | Disposition: A | Discharge status: Home / Self Care | Attending: Urology | Admitting: Urology

## 2023-11-30 ENCOUNTER — Ambulatory Visit (INDEPENDENT_AMBULATORY_CARE_PROVIDER_SITE_OTHER): Payer: Medicaid Other | Admitting: Urology

## 2023-11-30 VITALS — BP 130/77 | HR 73 | Ht 62.0 in | Wt 138.0 lb

## 2023-11-30 DIAGNOSIS — R310 Gross hematuria: Secondary | ICD-10-CM | POA: Insufficient documentation

## 2023-11-30 DIAGNOSIS — Z720 Tobacco use: Secondary | ICD-10-CM | POA: Diagnosis not present

## 2023-11-30 DIAGNOSIS — R8290 Unspecified abnormal findings in urine: Secondary | ICD-10-CM | POA: Diagnosis not present

## 2023-11-30 LAB — URINALYSIS, COMPLETE (UACMP) WITH MICROSCOPIC
Bilirubin Urine: NEGATIVE
Glucose, UA: NEGATIVE mg/dL
Ketones, ur: NEGATIVE mg/dL
Leukocytes,Ua: NEGATIVE
Nitrite: NEGATIVE
Protein, ur: NEGATIVE mg/dL
Specific Gravity, Urine: <1.005 — ABNORMAL LOW (ref 1.005–1.030)
WBC, UA: NONE SEEN WBC/hpf (ref 0–5)
pH: 6 (ref 5.0–8.0)

## 2023-11-30 NOTE — Progress Notes (Addendum)
 Marcelle Overlie Plume,acting as a scribe for Vanna Scotland, MD.,have documented all relevant documentation on the behalf of Vanna Scotland, MD,as directed by  Vanna Scotland, MD while in the presence of Vanna Scotland, MD.  11/30/23 12:28 PM   Sherry Flynn 06/14/1961 [id number]  Referring provider: Damaris Schooner, DO [address] [place],  [place] [address]  Chief Complaint  Patient presents with   Hematuria    HPI:  63 year old female referred for evaluation of incidental microscopic hematuria. She has a history of smoking and underwent bladder surgery in the past to have it 'tacked up.' She occasionally experiences urinary frequency and leakage.   A urinalysis today shows no blood, and a previous dipstick test a month ago showed trace blood but no microscopic evidence. She reports a history of trace blood in urine since 2014-15, but no visible blood or history of kidney stones.   She also had a UA in 2020 with trace blood on dip but negative for RBC on micro.  She experiences increased thirst and urination, which she attributes to high fluid intake. She is a smoker and expresses a desire to quit. She also consumes a significant amount of Diet Coke. She has a history of migraines and is on a low dose of Amitriptyline, which she believes has caused her urine to darken.   She had a CT urogram on 11/06/23 which was personally reviewed and unremarkable.    PMH: Past Medical History:  Diagnosis Date   Adjustment reaction with anxiety    Dysrhythmia    Hypertension     Surgical History: Past Surgical History:  Procedure Laterality Date   ABDOMINAL HYSTERECTOMY     COLONOSCOPY WITH PROPOFOL N/A 05/10/2023   Procedure: COLONOSCOPY WITH PROPOFOL;  Surgeon: Toney Reil, MD;  Location: [facility];  Service: Endoscopy;  Laterality: N/A;   POLYPECTOMY  05/10/2023   Procedure: POLYPECTOMY;  Surgeon: Toney Reil, MD;  Location: [facility];   Service: Endoscopy;;    Home Medications:  Allergies as of 11/30/2023       Reactions   Bupropion Other (See Comments)   Trouble focusing        Medication List        Accurate as of November 30, 2023 12:28 PM. If you have any questions, ask your nurse or doctor.          acetaminophen 325 MG tablet Commonly known as: TYLENOL Take by mouth.   amitriptyline 10 MG tablet Commonly known as: ELAVIL Take 10 mg by mouth at bedtime.   amLODipine 10 MG tablet Commonly known as: NORVASC Take 1 tablet by mouth daily.   aspirin EC 81 MG tablet Take by mouth.   atorvastatin 20 MG tablet Commonly known as: LIPITOR Take 20 mg by mouth daily. What changed: Another medication with the same name was removed. Continue taking this medication, and follow the directions you see here. Changed by: Vanna Scotland   cholecalciferol 25 MCG (1000 UNIT) tablet Commonly known as: VITAMIN D3 Take 1,000 Units by mouth daily.   ibuprofen 200 MG tablet Commonly known as: ADVIL Take by mouth.   metFORMIN 500 MG tablet Commonly known as: GLUCOPHAGE Take 1 tablet by mouth daily with breakfast.   metoprolol succinate 25 MG 24 hr tablet Commonly known as: TOPROL-XL Take 1 tablet by mouth daily.   nicotine 7 mg/24hr patch Commonly known as: NICODERM CQ - dosed in mg/24 hr Place 7 mg onto the skin daily.  Allergies:  Allergies  Allergen Reactions   Bupropion Other (See Comments)    Trouble focusing    Family History: Family History  Problem Relation Age of Onset   Breast cancer Maternal Aunt 63   Breast cancer Maternal Grandmother        mat great gm    Social History:  has no history on file for tobacco use, alcohol use, and drug use.   Physical Exam: BP 130/77 (BP Location: Left Arm, Patient Position: Sitting, Cuff Size: Normal)   Pulse 73   Ht 5\' 2"  (1.575 m)   Wt 138 lb (62.6 kg)   SpO2 100%   BMI 25.24 kg/m   Constitutional:  Alert and oriented, No acute  distress. HEENT: [place] AT, moist mucus membranes.  Trachea midline, no masses. Neurologic: Grossly intact, no focal deficits, moving all 4 extremities. Psychiatric: Normal mood and affect.  Results for orders placed or performed during the hospital encounter of 11/30/23  Urinalysis, Complete w Microscopic -   Collection Time: 11/30/23 10:07 AM  Result Value Ref Range   Color, Urine YELLOW YELLOW   APPearance CLEAR CLEAR   Specific Gravity, Urine <1.005 (L) 1.005 - 1.030   pH 6.0 5.0 - 8.0   Glucose, UA NEGATIVE NEGATIVE mg/dL   Hgb urine dipstick TRACE (A) NEGATIVE   Bilirubin Urine NEGATIVE NEGATIVE   Ketones, ur NEGATIVE NEGATIVE mg/dL   Protein, ur NEGATIVE NEGATIVE mg/dL   Nitrite NEGATIVE NEGATIVE   Leukocytes,Ua NEGATIVE NEGATIVE   Squamous Epithelial / HPF 0-5 0 - 5 /HPF   WBC, UA NONE SEEN 0 - 5 WBC/hpf   RBC / HPF 0-5 0 - 5 RBC/hpf   Bacteria, UA RARE (A) NONE SEEN     Assessment & Plan:    1. Abnormal urine - She has a history of trace hematuria on dipstick urinalysis, but no microscopic hematuria has been confirmed. The dipstick test is prone to false positives, and the microscopic evaluation is the gold standard, which currently shows no blood.  - No further hematuria evaluation is needed at this time.  - Recommend annual microscopic urinalysis to monitor for any changes, especially given her smoking history.  2. Smoking Cessation - She is a smoker and has expressed a desire to quit.  - Discussed the risks associated with smoking, including lung and bladder cancer.  - Encouraged smoking cessation and discussed potential aids such as Chantix. She has tried the CBQ method but finds it challenging to adhere to due to time constraints.  3. Urinary Frequency and Leakage - She reports occasional urinary frequency and leakage, but no new symptoms or significant changes. - No immediate intervention is required, but monitoring and lifestyle modifications, such as reducing  caffeine intake, may be beneficial.  F/u prn  [facility] [address] [place], [place] [address] [phone]

## 2024-02-25 ENCOUNTER — Encounter (INDEPENDENT_AMBULATORY_CARE_PROVIDER_SITE_OTHER): Payer: Self-pay | Admitting: Vascular Surgery

## 2024-02-25 ENCOUNTER — Ambulatory Visit (INDEPENDENT_AMBULATORY_CARE_PROVIDER_SITE_OTHER): Admitting: Vascular Surgery

## 2024-02-25 VITALS — BP 131/79 | HR 85 | Ht 62.0 in | Wt 144.4 lb

## 2024-02-25 DIAGNOSIS — R200 Anesthesia of skin: Secondary | ICD-10-CM | POA: Insufficient documentation

## 2024-02-25 DIAGNOSIS — R202 Paresthesia of skin: Secondary | ICD-10-CM

## 2024-02-25 DIAGNOSIS — I6523 Occlusion and stenosis of bilateral carotid arteries: Secondary | ICD-10-CM

## 2024-02-25 DIAGNOSIS — I10 Essential (primary) hypertension: Secondary | ICD-10-CM | POA: Diagnosis not present

## 2024-02-25 DIAGNOSIS — M51369 Other intervertebral disc degeneration, lumbar region without mention of lumbar back pain or lower extremity pain: Secondary | ICD-10-CM

## 2024-02-25 DIAGNOSIS — I6529 Occlusion and stenosis of unspecified carotid artery: Secondary | ICD-10-CM | POA: Insufficient documentation

## 2024-02-25 NOTE — Progress Notes (Addendum)
 MRN : [id number]  Sherry Flynn is a 63 y.o. (Nov 05, 1960) female who presents with chief complaint of check carotid arteries.  History of Present Illness:   The patient is seen for evaluation of carotid stenosis. The carotid stenosis was identified after she experienced numbness in pins and needle like sensations in the arm as well as some visual changes..  The patient denies episodes of amaurosis fugax since the ultrasound was obtained. There is no recent history of TIA symptoms or focal motor deficits. There is no prior documented CVA.  There is a history of migraine headaches. There is no history of seizures.  The patient is taking enteric-coated aspirin 81 mg daily.  No recent shortening of the patient's walking distance or new symptoms consistent with claudication she walks on a regular basis.  No history of rest pain symptoms. No ulcers or wounds of the lower extremities have occurred.  There is no history of DVT, PE or superficial thrombophlebitis. No recent episodes of angina or shortness of breath documented.   Duplex ultrasound of the carotid arteries obtained at [facility] dated Jan 21, 2024 shows 50 to 69% stenosis bilateral internal carotid arteries.  Vertebral arteries are antegrade bilaterally.  No outpatient medications have been marked as taking for the 02/25/24 encounter (Appointment) with Jama, Cordella MATSU, MD.    Past Medical History:  Diagnosis Date   Adjustment reaction with anxiety    Dysrhythmia    Hypertension     Past Surgical History:  Procedure Laterality Date   ABDOMINAL HYSTERECTOMY     COLONOSCOPY WITH PROPOFOL  N/A 05/10/2023   Procedure: COLONOSCOPY WITH PROPOFOL ;  Surgeon: Unk Corinn Skiff, MD;  Location: [facility];  Service: Endoscopy;  Laterality: N/A;   POLYPECTOMY  05/10/2023   Procedure: POLYPECTOMY;  Surgeon: Unk Corinn Skiff, MD;  Location: [facility];  Service: Endoscopy;;    Social History     Family History Family History  Problem Relation Age of Onset   Breast cancer Maternal Aunt 63   Breast cancer Maternal Grandmother        mat great gm    Allergies  Allergen Reactions   Bupropion Other (See Comments)    Trouble focusing     REVIEW OF SYSTEMS (Negative unless checked)  Constitutional: [] Weight loss  [] Fever  [] Chills Cardiac: [] Chest pain   [] Chest pressure   [] Palpitations   [] Shortness of breath when laying flat   [] Shortness of breath with exertion. Vascular:  [x] Pain in legs with walking   [] Pain in legs at rest  [] History of DVT   [] Phlebitis   [] Swelling in legs   [] Varicose veins   [] Non-healing ulcers Pulmonary:   [] Uses home oxygen   [] Productive cough   [] Hemoptysis   [] Wheeze  [] COPD   [] Asthma Neurologic:  [] Dizziness   [] Seizures   [] History of stroke   [] History of TIA  [] Aphasia   [] Vissual changes   [] Weakness or numbness in arm   [] Weakness or numbness in leg Musculoskeletal:   [] Joint swelling   [] Joint pain   [] Low back pain Hematologic:  [] Easy bruising  [] Easy bleeding   [] Hypercoagulable state   [] Anemic Gastrointestinal:  [] Diarrhea   [] Vomiting  [] Gastroesophageal reflux/heartburn   [] Difficulty swallowing. Genitourinary:  [] Chronic kidney disease   [] Difficult urination  [] Frequent urination   [] Blood in urine Skin:  [] Rashes   [] Ulcers  Psychological:  [] History of anxiety   []  History of major depression.  Physical Examination  There were no vitals filed for this visit. There is no height or weight on file to calculate BMI. Gen: WD/WN, NAD Head: [place]/AT, No temporalis wasting.  Ear/Nose/Throat: Hearing grossly intact, nares w/o erythema or drainage Eyes: PER, EOMI, sclera nonicteric.  Neck: Supple, no masses.  No bruit or JVD.  Pulmonary:  Good air movement, no audible wheezing, no use of accessory muscles.  Cardiac: RRR, normal S1, S2, no Murmurs. Vascular:  carotid bruit noted Vessel Right Left  Radial Palpable Palpable   Carotid  Palpable  Palpable  Subclav  Palpable Palpable  Gastrointestinal: soft, non-distended. No guarding/no peritoneal signs.  Musculoskeletal: M/S 5/5 throughout.  No visible deformity.  Neurologic: CN 2-12 intact. Pain and light touch intact in extremities.  Symmetrical.  Speech is fluent. Motor exam as listed above. Psychiatric: Judgment intact, Mood & affect appropriate for pt's clinical situation. Dermatologic: No rashes or ulcers noted.  No changes consistent with cellulitis.   CBC Lab Results  Component Value Date   WBC 9.3 09/23/2013   HGB 14.1 09/23/2013   HCT 40.8 09/23/2013   MCV 85 09/23/2013   PLT 200 09/23/2013    BMET    Component Value Date/Time   NA 142 09/23/2013 1239   K 3.6 09/23/2013 1239   CL 108 (H) 09/23/2013 1239   CO2 28 09/23/2013 1239   GLUCOSE 107 (H) 09/23/2013 1239   BUN 13 09/23/2013 1239   CREATININE 0.84 09/23/2013 1239   CALCIUM 8.9 09/23/2013 1239   GFRNONAA >60 09/23/2013 1239   GFRAA >60 09/23/2013 1239   CrCl cannot be calculated (Patient's most recent lab result is older than the maximum 21 days allowed.).  COAG No results found for: INR, PROTIME  Radiology No results found.   Assessment/Plan 1. Bilateral carotid artery stenosis (Primary) Recommend:  The patient is symptomatic with respect to the carotid stenosis.  She has bilateral lesions that are 50-69% by duplex ultrasound.  Patient should undergo CT angiography of the carotid arteries to define the degree of stenosis of the internal carotid arteries bilaterally and the anatomic suitability for surgery vs. Intervention with stenting if indicated.  Furthermore, CT angiography will also exclude intracranial lesions as well as proximal lesions of the common carotid arteries.  Given her neurologic symptoms I believe further examination is indicated.  The risks, benefits and alternative therapies were reviewed in detail with the patient.  All questions were answered.   The patient agrees to proceed with imaging.  Continue antiplatelet therapy as prescribed. Continue management of CAD, HTN and Hyperlipidemia. Healthy heart diet, encouraged exercise at least 4 times per week. - CT ANGIO HEAD NECK W WO CM; Future - CT ANGIO HEAD NECK W WO CM; Future  2. Numbness and tingling of right arm See #1 - CT ANGIO HEAD NECK W WO CM; Future - CT ANGIO HEAD NECK W WO CM; Future  3. Degeneration of intervertebral disc of lumbar region, unspecified whether pain present Continue medications to treat the patient's degenerative disease as already ordered, these medications have been reviewed and there are no changes at this time.  Continued activity and therapy was stressed.  4. Essential hypertension Continue antihypertensive medications as already ordered, these medications have been reviewed and there are no changes at this time.    Cordella Shawl, MD  02/25/2024 1:02 PM

## 2024-02-27 ENCOUNTER — Encounter (INDEPENDENT_AMBULATORY_CARE_PROVIDER_SITE_OTHER): Payer: Self-pay | Admitting: Vascular Surgery

## 2024-03-04 ENCOUNTER — Ambulatory Visit: Admission: RE | Admit: 2024-03-04 | Source: Ambulatory Visit

## 2024-03-06 ENCOUNTER — Ambulatory Visit (INDEPENDENT_AMBULATORY_CARE_PROVIDER_SITE_OTHER): Admitting: Vascular Surgery

## 2024-03-06 ENCOUNTER — Encounter (INDEPENDENT_AMBULATORY_CARE_PROVIDER_SITE_OTHER): Payer: Self-pay

## 2024-03-06 ENCOUNTER — Telehealth (INDEPENDENT_AMBULATORY_CARE_PROVIDER_SITE_OTHER): Payer: Self-pay | Admitting: Vascular Surgery

## 2024-03-06 ENCOUNTER — Ambulatory Visit

## 2024-03-06 NOTE — Telephone Encounter (Signed)
 LVM for pt after calling cell phone with no answer. I ATC to advise of still waiting on prior auth for CT. Pt did call back to clinic and I was able to advise that I will call when I receive a prior auth. Pt acknowledged.

## 2024-03-13 ENCOUNTER — Telehealth (INDEPENDENT_AMBULATORY_CARE_PROVIDER_SITE_OTHER): Payer: Self-pay | Admitting: Vascular Surgery

## 2024-03-13 ENCOUNTER — Ambulatory Visit

## 2024-03-13 NOTE — Telephone Encounter (Signed)
 Pt LVM on my VM on 6.25.25. I received message on 6.26.25. Message states that she was told that Dr. Kathryn office is not doing what they are supposed to be doing for the prior auth. I submitted the prior auth and it is still being reviewed by [facility] physicians. I LVM for pt TCB and let me know what I needed to be doing.

## 2024-03-26 ENCOUNTER — Ambulatory Visit
Admission: RE | Admit: 2024-03-26 | Discharge: 2024-03-26 | Disposition: A | Discharge status: Home / Self Care | Source: Ambulatory Visit | Attending: Vascular Surgery | Admitting: Vascular Surgery

## 2024-03-26 DIAGNOSIS — R202 Paresthesia of skin: Secondary | ICD-10-CM | POA: Diagnosis present

## 2024-03-26 DIAGNOSIS — I6523 Occlusion and stenosis of bilateral carotid arteries: Secondary | ICD-10-CM | POA: Diagnosis present

## 2024-03-26 DIAGNOSIS — R200 Anesthesia of skin: Secondary | ICD-10-CM | POA: Insufficient documentation

## 2024-03-26 DIAGNOSIS — H532 Diplopia: Secondary | ICD-10-CM | POA: Diagnosis present

## 2024-03-26 DIAGNOSIS — J439 Emphysema, unspecified: Secondary | ICD-10-CM | POA: Diagnosis not present

## 2024-03-26 MED ORDER — IOHEXOL 350 MG/ML SOLN
75.0000 mL | Freq: Once | INTRAVENOUS | Status: AC | PRN
  Administered 2024-03-26: 75 mL via INTRAVENOUS

## 2024-04-05 NOTE — Progress Notes (Unsigned)
 MRN : [id number]  Sherry Flynn is a 63 y.o. (03-21-1961) female who presents with chief complaint of check carotid arteries.  History of Present Illness:   The patient is seen for follow up evaluation of carotid stenosis status post CT angiogram. CT scan was done 03/27/2024.  Patient reports that the test went well with no problems or complications.   The patient denies interval amaurosis fugax. There is no recent or interval TIA symptoms or focal motor deficits. There is no prior documented CVA.  The patient is taking enteric-coated aspirin 81 mg daily.  There is no history of migraine headaches. There is no history of seizures.  No recent shortening of the patient's walking distance or new symptoms consistent with claudication.  No history of rest pain symptoms. No new ulcers or wounds of the lower extremities have occurred.  There is no history of DVT, PE or superficial thrombophlebitis. No recent episodes of angina or shortness of breath documented.   CT angiogram is reviewed by me personally and shows <25% stenosis of the bilateral internal carotid artery.   No outpatient medications have been marked as taking for the 04/07/24 encounter (Appointment) with Jama, Cordella MATSU, MD.    Past Medical History:  Diagnosis Date   Adjustment reaction with anxiety    Dysrhythmia    Hypertension     Past Surgical History:  Procedure Laterality Date   ABDOMINAL HYSTERECTOMY     COLONOSCOPY WITH PROPOFOL  N/A 05/10/2023   Procedure: COLONOSCOPY WITH PROPOFOL ;  Surgeon: Unk Corinn Skiff, MD;  Location: [facility];  Service: Endoscopy;  Laterality: N/A;   POLYPECTOMY  05/10/2023   Procedure: POLYPECTOMY;  Surgeon: Unk Corinn Skiff, MD;  Location: [facility];  Service: Endoscopy;;    Social History Social History   Tobacco Use   Smoking status: Every Day    Current packs/day: 1.00    Types: Cigarettes   Smokeless tobacco: Never   Substance Use Topics   Alcohol use: Never    Family History Family History  Problem Relation Age of Onset   Breast cancer Maternal Aunt 63   Breast cancer Maternal Grandmother        mat great gm    Allergies  Allergen Reactions   Bupropion Other (See Comments)    Trouble focusing     REVIEW OF SYSTEMS (Negative unless checked)  Constitutional: [] Weight loss  [] Fever  [] Chills Cardiac: [] Chest pain   [] Chest pressure   [] Palpitations   [] Shortness of breath when laying flat   [] Shortness of breath with exertion. Vascular:  [x] Pain in legs with walking   [] Pain in legs at rest  [] History of DVT   [] Phlebitis   [] Swelling in legs   [] Varicose veins   [] Non-healing ulcers Pulmonary:   [] Uses home oxygen   [] Productive cough   [] Hemoptysis   [] Wheeze  [] COPD   [] Asthma Neurologic:  [] Dizziness   [] Seizures   [] History of stroke   [] History of TIA  [] Aphasia   [] Vissual changes   [] Weakness or numbness in arm   [x] Weakness or numbness in leg Musculoskeletal:   [] Joint swelling   [x] Joint pain   [x] Low back pain Hematologic:  [] Easy bruising  [] Easy bleeding   [] Hypercoagulable state   [] Anemic Gastrointestinal:  [] Diarrhea   [] Vomiting  [] Gastroesophageal reflux/heartburn   [] Difficulty swallowing. Genitourinary:  [] Chronic kidney disease   []   Difficult urination  [] Frequent urination   [] Blood in urine Skin:  [] Rashes   [] Ulcers  Psychological:  [] History of anxiety   []  History of major depression.  Physical Examination  There were no vitals filed for this visit. There is no height or weight on file to calculate BMI. Gen: WD/WN, NAD Head: [place]/AT, No temporalis wasting.  Ear/Nose/Throat: Hearing grossly intact, nares w/o erythema or drainage Eyes: PER, EOMI, sclera nonicteric.  Neck: Supple, no masses.  No bruit or JVD.  Pulmonary:  Good air movement, no audible wheezing, no use of accessory muscles.  Cardiac: RRR, normal S1, S2, no Murmurs. Vascular:  carotid bruit  noted Vessel Right Left  Radial Palpable Palpable  Carotid  Palpable  Palpable  Gastrointestinal: soft, non-distended. No guarding/no peritoneal signs.  Musculoskeletal: M/S 5/5 throughout.  No visible deformity.  Neurologic: CN 2-12 intact. Pain and light touch intact in extremities.  Symmetrical.  Speech is fluent. Motor exam as listed above. Psychiatric: Judgment intact, Mood & affect appropriate for pt's clinical situation. Dermatologic: No rashes or ulcers noted.  No changes consistent with cellulitis.   CBC Lab Results  Component Value Date   WBC 9.3 09/23/2013   HGB 14.1 09/23/2013   HCT 40.8 09/23/2013   MCV 85 09/23/2013   PLT 200 09/23/2013    BMET    Component Value Date/Time   NA 142 09/23/2013 1239   K 3.6 09/23/2013 1239   CL 108 (H) 09/23/2013 1239   CO2 28 09/23/2013 1239   GLUCOSE 107 (H) 09/23/2013 1239   BUN 13 09/23/2013 1239   CREATININE 0.84 09/23/2013 1239   CALCIUM 8.9 09/23/2013 1239   GFRNONAA >60 09/23/2013 1239   GFRAA >60 09/23/2013 1239   CrCl cannot be calculated (Patient's most recent lab result is older than the maximum 21 days allowed.).  COAG No results found for: INR, PROTIME  Radiology CT ANGIO HEAD NECK W WO CM Result Date: 03/27/2024 CLINICAL DATA:  Diplopia. Carotid stenosis by ultrasound. Numbness of the right arm. EXAM: CT ANGIOGRAPHY HEAD AND NECK WITH AND WITHOUT CONTRAST TECHNIQUE: Multidetector CT imaging of the head and neck was performed using the standard protocol during bolus administration of intravenous contrast. Multiplanar CT image reconstructions and MIPs were obtained to evaluate the vascular anatomy. Carotid stenosis measurements (when applicable) are obtained utilizing NASCET criteria, using the distal internal carotid diameter as the denominator. RADIATION DOSE REDUCTION: This exam was performed according to the departmental dose-optimization program which includes automated exposure control, adjustment of the mA  and/or kV according to patient size and/or use of iterative reconstruction technique. CONTRAST:  75mL OMNIPAQUE  IOHEXOL  350 MG/ML SOLN COMPARISON:  Brain MRI 10/22/2023 FINDINGS: CT HEAD FINDINGS Brain: The brain shows a normal appearance without evidence of malformation, atrophy, old or acute small or large vessel infarction, mass lesion, hemorrhage, hydrocephalus or extra-axial collection. Vascular: No hyperdense vessel. No evidence of atherosclerotic calcification. Skull: Normal.  No traumatic finding.  No focal bone lesion. Sinuses/Orbits: Sinuses are clear. Orbits appear normal. Mastoids are clear. Other: None significant CTA NECK FINDINGS Aortic arch: Minimal atherosclerotic calcification at the origins of the brachiocephalic vessels but no stenosis. Normal branching pattern. Right carotid system: Common carotid artery widely patent to the bifurcation. Soft and calcified plaque at the carotid bifurcation and ICA bulb. No stenosis greater than 20%. Cervical ICA normal beyond that. Left carotid system: Common carotid artery widely patent to the bifurcation. Calcified plaque at the carotid bifurcation and ICA bulb. Minimal diameter of the proximal  ICA measures 3 mm. Compared to a more distal cervical ICA diameter of 4 mm, this indicates a 25% stenosis. Vertebral arteries: Both vertebral artery origins are widely patent. Both vertebral arteries are normal through the cervical region to the foramen magnum. Skeleton: Ordinary cervical spondylosis. Other neck: No mass or lymphadenopathy. Upper chest: Mild emphysema and pulmonary scarring at the apices. Review of the MIP images confirms the above findings CTA HEAD FINDINGS Anterior circulation: Both internal carotid arteries are patent through the skull base and siphon regions. Ordinary siphon atherosclerotic calcification but no stenosis. The anterior and middle cerebral vessels are patent. No stenosis or occlusion. No aneurysm or vascular malformation. Fetal origin  posterior cerebral arteries. Posterior circulation: Both vertebral arteries are patent through the foramen magnum to the basilar artery. No basilar stenosis. Posterior cerebral arteries take fetal origin from the anterior circulation. Venous sinuses: Patent and normal. Anatomic variants: None significant otherwise. Review of the MIP images confirms the above findings IMPRESSION: 1. No acute head CT finding. Normal appearance of the brain itself. 2. Atherosclerotic disease at both carotid bifurcations. No flow limiting stenoses on either side. Minor (20-25%) stenoses. 3. No intracranial large or medium vessel occlusion or correctable proximal stenosis. 4. Emphysema. Emphysema (ICD10-J43.9). Electronically Signed   By: Oneil Officer M.D.   On: 03/27/2024 11:50     Assessment/Plan 1. Bilateral carotid artery stenosis (Primary) Recommend:  Given the patient's asymptomatic subcritical stenosis no further invasive testing or surgery at this time.  The CT scan is reviewed by me with the patient and shows minimal atherosclerotic changes.  Duplex ultrasound shows <20% stenosis bilaterally which has been unchanged when compared to the previous studies.  Continue antiplatelet therapy as prescribed Continue management of CAD, HTN and Hyperlipidemia Healthy heart diet,  encouraged exercise at least 4 times per week  Given the stable <20% bilateral carotid stenosis the patient will follow up PRN.  The patient is told that if symptoms of a TIA should occur then he should go to the ER and I should be notified, as this would change the management course.  The patient/family voices understanding.  2. Essential hypertension Continue antihypertensive medications as already ordered, these medications have been reviewed and there are no changes at this time.  3. Degeneration of intervertebral disc of lumbar region, unspecified whether pain present Continue medications to treat the patient's degenerative disease as  already ordered, these medications have been reviewed and there are no changes at this time.  Continued activity and therapy was stressed.  4. Pure hypercholesterolemia Continue statin as ordered and reviewed, no changes at this time    Cordella Shawl, MD  04/05/2024 3:39 PM

## 2024-04-07 ENCOUNTER — Encounter (INDEPENDENT_AMBULATORY_CARE_PROVIDER_SITE_OTHER): Payer: Self-pay | Admitting: Vascular Surgery

## 2024-04-07 ENCOUNTER — Ambulatory Visit (INDEPENDENT_AMBULATORY_CARE_PROVIDER_SITE_OTHER): Admitting: Vascular Surgery

## 2024-04-07 VITALS — BP 102/68 | HR 73 | Resp 16 | Ht 61.0 in | Wt 144.0 lb

## 2024-04-07 DIAGNOSIS — I6523 Occlusion and stenosis of bilateral carotid arteries: Secondary | ICD-10-CM | POA: Diagnosis not present

## 2024-04-07 DIAGNOSIS — M51369 Other intervertebral disc degeneration, lumbar region without mention of lumbar back pain or lower extremity pain: Secondary | ICD-10-CM | POA: Diagnosis not present

## 2024-04-07 DIAGNOSIS — I10 Essential (primary) hypertension: Secondary | ICD-10-CM | POA: Diagnosis not present

## 2024-04-07 DIAGNOSIS — E7800 Pure hypercholesterolemia, unspecified: Secondary | ICD-10-CM | POA: Diagnosis not present

## 2024-04-09 ENCOUNTER — Encounter (INDEPENDENT_AMBULATORY_CARE_PROVIDER_SITE_OTHER): Payer: Self-pay | Admitting: Vascular Surgery

## 2024-04-22 ENCOUNTER — Other Ambulatory Visit: Payer: Self-pay | Admitting: Family Medicine

## 2024-04-22 DIAGNOSIS — Z1231 Encounter for screening mammogram for malignant neoplasm of breast: Secondary | ICD-10-CM

## 2024-05-22 ENCOUNTER — Other Ambulatory Visit: Payer: Self-pay | Admitting: Family Medicine

## 2024-05-22 DIAGNOSIS — F17210 Nicotine dependence, cigarettes, uncomplicated: Secondary | ICD-10-CM

## 2024-06-05 ENCOUNTER — Ambulatory Visit
Admission: RE | Admit: 2024-06-05 | Discharge: 2024-06-05 | Disposition: A | Discharge status: Home / Self Care | Source: Ambulatory Visit | Attending: Family Medicine | Admitting: Family Medicine

## 2024-06-05 DIAGNOSIS — F17210 Nicotine dependence, cigarettes, uncomplicated: Secondary | ICD-10-CM | POA: Insufficient documentation

## 2024-06-05 DIAGNOSIS — Z1231 Encounter for screening mammogram for malignant neoplasm of breast: Secondary | ICD-10-CM | POA: Insufficient documentation
